# Patient Record
Sex: Male | Born: 1960 | Race: White | Hispanic: No | Marital: Married | Smoking: Current every day smoker
Health system: Southern US, Community
[De-identification: ages and names within clinical notes are randomized; demographics above are authoritative.]

## PROBLEM LIST (undated history)

## (undated) DIAGNOSIS — I10 Essential (primary) hypertension: Secondary | ICD-10-CM

## (undated) DIAGNOSIS — C801 Malignant (primary) neoplasm, unspecified: Secondary | ICD-10-CM

## (undated) DIAGNOSIS — E7800 Pure hypercholesterolemia, unspecified: Secondary | ICD-10-CM

## (undated) DIAGNOSIS — S31119A Laceration without foreign body of abdominal wall, unspecified quadrant without penetration into peritoneal cavity, initial encounter: Secondary | ICD-10-CM

## (undated) HISTORY — PX: HERNIA REPAIR: SHX51

## (undated) HISTORY — PX: OTHER SURGICAL HISTORY: SHX169

---

## 2009-03-30 ENCOUNTER — Emergency Department: Payer: Self-pay | Admitting: Emergency Medicine

## 2009-07-26 ENCOUNTER — Telehealth: Payer: Self-pay | Admitting: Family Medicine

## 2009-07-26 ENCOUNTER — Ambulatory Visit: Payer: Self-pay | Admitting: Family Medicine

## 2009-07-26 DIAGNOSIS — F329 Major depressive disorder, single episode, unspecified: Secondary | ICD-10-CM

## 2009-07-26 DIAGNOSIS — E785 Hyperlipidemia, unspecified: Secondary | ICD-10-CM

## 2009-07-26 DIAGNOSIS — N508 Other specified disorders of male genital organs: Secondary | ICD-10-CM

## 2009-07-26 DIAGNOSIS — I10 Essential (primary) hypertension: Secondary | ICD-10-CM | POA: Insufficient documentation

## 2009-07-26 DIAGNOSIS — G4700 Insomnia, unspecified: Secondary | ICD-10-CM

## 2009-07-26 DIAGNOSIS — F411 Generalized anxiety disorder: Secondary | ICD-10-CM | POA: Insufficient documentation

## 2009-07-27 LAB — CONVERTED CEMR LAB
ALT: 28 units/L (ref 0–53)
AST: 22 units/L (ref 0–37)
Albumin: 4.2 g/dL (ref 3.5–5.2)
Alkaline Phosphatase: 67 units/L (ref 39–117)
BUN: 14 mg/dL (ref 6–23)
Bilirubin, Direct: 0.1 mg/dL (ref 0.0–0.3)
CO2: 25 meq/L (ref 19–32)
Calcium: 9.4 mg/dL (ref 8.4–10.5)
Chloride: 104 meq/L (ref 96–112)
Cholesterol: 175 mg/dL (ref 0–200)
Creatinine, Ser: 0.9 mg/dL (ref 0.4–1.5)
Direct LDL: 115.6 mg/dL
GFR calc non Af Amer: 95.54 mL/min (ref >60)
Glucose, Bld: 75 mg/dL (ref 70–99)
HDL: 30.6 mg/dL — ABNORMAL LOW (ref >39.00)
Potassium: 4.4 meq/L (ref 3.5–5.1)
Sodium: 140 meq/L (ref 135–145)
Total Bilirubin: 0.7 mg/dL (ref 0.3–1.2)
Total CHOL/HDL Ratio: 6
Total Protein: 7.3 g/dL (ref 6.0–8.3)
Triglycerides: 358 mg/dL — ABNORMAL HIGH (ref 0.0–149.0)
VLDL: 71.6 mg/dL — ABNORMAL HIGH (ref 0.0–40.0)

## 2009-07-30 ENCOUNTER — Encounter: Admission: RE | Admit: 2009-07-30 | Discharge: 2009-07-30 | Payer: Self-pay | Admitting: Family Medicine

## 2009-08-16 ENCOUNTER — Ambulatory Visit: Payer: Self-pay | Admitting: Family Medicine

## 2009-08-16 DIAGNOSIS — F17200 Nicotine dependence, unspecified, uncomplicated: Secondary | ICD-10-CM

## 2009-08-30 ENCOUNTER — Ambulatory Visit: Payer: Self-pay | Admitting: Pulmonary Disease

## 2009-09-11 DIAGNOSIS — F518 Other sleep disorders not due to a substance or known physiological condition: Secondary | ICD-10-CM

## 2009-09-11 DIAGNOSIS — G2581 Restless legs syndrome: Secondary | ICD-10-CM

## 2009-09-11 DIAGNOSIS — G4733 Obstructive sleep apnea (adult) (pediatric): Secondary | ICD-10-CM | POA: Insufficient documentation

## 2009-09-28 ENCOUNTER — Telehealth: Payer: Self-pay | Admitting: Pulmonary Disease

## 2009-10-12 ENCOUNTER — Encounter (INDEPENDENT_AMBULATORY_CARE_PROVIDER_SITE_OTHER): Payer: Self-pay | Admitting: *Deleted

## 2010-04-09 NOTE — Assessment & Plan Note (Signed)
Summary: ROA FOR 3 WEEK FOLLOW-UP/JRR   Vital Signs:  Patient profile:   50 year old male Height:      67 inches Weight:      239.13 pounds BMI:     37.59 Temp:     98.2 degrees F oral Pulse rate:   64 / minute Pulse rhythm:   regular BP sitting:   124 / 82  (left arm) Cuff size:   large  Vitals Entered By: Linde Gillis CMA Duncan Dull) (August 16, 2009 8:03 AM) CC: 3 week follow up   History of Present Illness: 50 yo here to follow up anxiety, depression, HDL, HTN and tobacco abuse.  1. HTN- has not been taking his Lisinopril 10 mg that was prescribed.  No cough or other side effects that he is aware of.  He wants to make sure he truly needs it.  Has not been taking it this month and BP is normal today!  2.  Anxiety/depression- always had issues with anxiety.  Has had panic attacks in past although it has not happened in months.  Also endorses symptoms of depression- anhedonia, tearfulness, fatigue.  No SI or HI.  Was prescribed Lexapro but he stopped taking it due to sexual side effects. Started Wellbutrin 150 mg last month, he feels like symptoms already improved.  More drive to get up and do things.  Eating less, has lost 5 pounds!  Sexual desire is back AND he has cut back cigarette smoking from 2 ppd to 1 ppd!.  3.  Hypertriglyercidemia- TG 358 last month, LDL 115.  Started Tricor 145 mg daily, no noticible side effects.    Current Medications (verified): 1)  Ropinirole Hcl 2 Mg Tabs (Ropinirole Hcl) .... Take One Tablet Daily At Bedtime 2)  Tricor 145 Mg Tabs (Fenofibrate) .... Take One Tablet By Mouth Daily 3)  Wellbutrin Xl 300 Mg Xr24h-Tab (Bupropion Hcl) .Marland Kitchen.. 1 Tab By Mouth Every Morning.  Allergies (verified): No Known Drug Allergies  Review of Systems      See HPI General:  Denies malaise. CV:  Denies chest pain or discomfort. MS:  Denies muscle aches. Psych:  Denies anxiety and depression.  Physical Exam  General:  overweight-appearing.   NAD weight down 5  pounds! Mouth:  good dentition.   Lungs:  normal respiratory effort, no intercostal retractions, no accessory muscle use, and normal breath sounds.   Heart:  normal rate, regular rhythm, and no murmur.   Extremities:  No clubbing, cyanosis, edema, or deformity noted with normal full range of motion of all joints.   Psych:  Cognition and judgment appear intact. Alert and cooperative with normal attention span and concentration. No apparent delusions, illusions, hallucinations Much more upbeat today.   Impression & Recommendations:  Problem # 1:  DEPRESSION (ICD-311) Assessment Improved Will increase dose of Wellbutrin to 300 mg XL.  Pt to call in one month with update. The following medications were removed from the medication list:    Wellbutrin Sr 150 Mg Xr12h-tab (Bupropion hcl) .Marland Kitchen... 1 tab by mouth every morning. His updated medication list for this problem includes:    Wellbutrin Xl 300 Mg Xr24h-tab (Bupropion hcl) .Marland Kitchen... 1 tab by mouth every morning.  Problem # 2:  HYPERLIPIDEMIA (ICD-272.4) Assessment: Unchanged No noticible side effects from Tricor.  Continue current dose, keep lab appt to recheck in 2 months. Discussed importance of continued weight loss, decreasing added sugars, eliminating trans fats, increasing fiber and limiting alcohol.  All these changes together  can drop triglycerides by almost 50%.  His updated medication list for this problem includes:    Tricor 145 Mg Tabs (Fenofibrate) .Marland Kitchen... Take one tablet by mouth daily  Problem # 3:  TOBACCO ABUSE (ICD-305.1) Assessment: Improved Congratulated on cutting back!  Problem # 4:  HYPERTENSION (ICD-401.9) Assessment: Improved Normal today.  Ok with continuing to hold Lisinopril.  May not need to restart if he continues losing weight and improving his lifestyle. The following medications were removed from the medication list:    Lisinopril 10 Mg Tabs (Lisinopril) .Marland Kitchen... Take one tablet by mouth daily  Complete  Medication List: 1)  Ropinirole Hcl 2 Mg Tabs (Ropinirole hcl) .... Take one tablet daily at bedtime 2)  Tricor 145 Mg Tabs (Fenofibrate) .... Take one tablet by mouth daily 3)  Wellbutrin Xl 300 Mg Xr24h-tab (Bupropion hcl) .Marland Kitchen.. 1 tab by mouth every morning. Prescriptions: WELLBUTRIN XL 300 MG XR24H-TAB (BUPROPION HCL) 1 tab by mouth every morning.  #30 x 6   Entered and Authorized by:   Ruthe Mannan MD   Signed by:   Ruthe Mannan MD on 08/16/2009   Method used:   Electronically to        [id number] (retail)       [address]       [place], [place]  [address]       Ph: [phone]       Fax: [phone]   RxID:   [id number]   Current Allergies (reviewed today): No known allergies

## 2010-04-09 NOTE — Progress Notes (Signed)
Summary: nos appt  Phone Note Call from Patient   Caller: [email or web address]  Call For: Adrin Julian Summary of Call: ATC to [facility]sc nos from 7/21, phone disconnected. Initial call taken by: Darletta Moll,  September 28, 2009 2:14 PM
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
## 2010-04-09 NOTE — Letter (Signed)
Summary: [place] No Show Letter  [place] [facility]  [address] [place], [place] [address]   Phone: [phone]  Fax: [phone]    10/12/2009 MRN: [id number]  Hunter Johnston [address] [place], [place]  [address]   Dear Mr. FIORELLA,   Our records indicate that you missed your scheduled appointment with __lab___________________ on ___8.4.11_________.  Please contact this office to reschedule your appointment as soon as possible.  It is important that you keep your scheduled appointments with your physician, so we can provide you the best care possible.  Please be advised that there may be a charge for "no show" appointments.    Sincerely,   [place] [facility]

## 2010-04-09 NOTE — Progress Notes (Signed)
Summary: ? Pulled muscle  Phone Note Call from Patient   Caller: Patient Call For: Ruthe Mannan MD Summary of Call: Patient stopped at front desk on his way out today and wanted Dr. Dayton Martes to be aware that he is having back pain.  Does not think it is related to kidney stones but thinks it may be a pulled muscle.  I spoke with his girlfriend/wife briefly and she said that he has taken nothing for pain and has not used heat to try and relieve the pain.  Please advise. Initial call taken by: Linde Gillis CMA Duncan Dull),  Jul 26, 2009 11:34 AM  Follow-up for Phone Call        Agreed.  Try OTC ibuprofen, heat/ice.  Will discuss at follow up as does not sound urgent. Ruthe Mannan MD  Jul 26, 2009 11:46 AM

## 2010-04-09 NOTE — Assessment & Plan Note (Signed)
Summary: NEW PT TO EST/CLE   Vital Signs:  Patient profile:   50 year old male Height:      67 inches Weight:      244.38 pounds BMI:     38.41 Temp:     98.2 degrees F oral Pulse rate:   68 / minute Pulse rhythm:   regular BP sitting:   140 / 90  (left arm) Cuff size:   large  Vitals Entered By: Linde Gillis CMA Duncan Dull) (Jul 26, 2009 10:29 AM) CC: new patient, establish care   History of Present Illness: 50 yo here to establish care with multiple issues to discuss today.  1. HTN- has not been taking his Lisinopril 10 mg that was prescribed.  No cough or other side effects that he is aware of.  He wants to make sure he truly needs it.  2.  Insomnia- chronic issue, deteriorating over several years.  Girlfriend says he legs move in bed, has a hard time falling asleep.  Once he is asleep, he snores and wakes himself up.  She is unsure if he "chokes" in his sleep.  Has been on Ambien, Benzos, most recently Requip which he feels is not helping much.  3.  Anxiety- always had issues with anxiety.  Has had panic attacks in past although it has not happened in months.  Also endorses symptoms of depression- anhedonia, tearfulness, fatigue.  No SI or HI.  Was prescribed Lexapro but he stopped taking it due to sexual side effects.  4.  Tobacco abuse- 2 ppd x 30 years.  Quit for one year once, cold [place].  Not interested in quitting, says he enjoys smoking.  5.  Right scrotal mass- noticed it several months ago, slowly getting bigger, sometimes a little sore.  No redness or wamrth.  Not draning.  No fevers or chills.  Preventive Screening-Counseling & Management  Caffeine-Diet-Exercise     Does Patient Exercise: no      Drug Use:  no.    Current Medications (verified): 1)  Lisinopril 10 Mg Tabs (Lisinopril) .... Take One Tablet By Mouth Daily 2)  Ropinirole Hcl 2 Mg Tabs (Ropinirole Hcl) .... Take One Tablet Daily At Bedtime 3)  Proventil Hfa 108 (90 Base) Mcg/act Aers (Albuterol  Sulfate) .... Take As Directed 4)  Wellbutrin Sr 150 Mg Xr12h-Tab (Bupropion Hcl) .Marland Kitchen.. 1 Tab By Mouth Every Morning.  Allergies (verified): No Known Drug Allergies  Past History:  Past Medical History: Anxiety Depression Hyperlipidemia Hypertension Tobacco Abuse  Past Surgical History: Denies surgical history  Family History: Reviewed history and no changes required. Dad- Mi in 48s, lived into his 53s Mom still alive  Social History: Reviewed history and no changes required. Smokes 2 ppd lives with his girlfriend. works for a Production assistant, radio, does Youth worker. Drug use-no Regular exercise-no Drug Use:  no Does Patient Exercise:  no  Review of Systems      See HPI General:  Denies fever. Eyes:  Denies blurring. ENT:  Denies difficulty swallowing. CV:  Denies chest pain or discomfort, difficulty breathing at night, difficulty breathing while lying down, leg cramps with exertion, palpitations, and shortness of breath with exertion. Resp:  Denies shortness of breath. GI:  Denies abdominal pain and change in bowel habits. GU:  Complains of decreased libido and erectile dysfunction; denies discharge, dysuria, incontinence, urinary frequency, and urinary hesitancy. MS:  Denies joint pain, joint redness, and joint swelling. Derm:  Complains of lesion(s). Neuro:  Denies visual disturbances. Psych:  Complains of anxiety, depression, easily tearful, irritability, and panic attacks; denies mental problems, sense of great danger, suicidal thoughts/plans, thoughts of violence, unusual visions or sounds, and thoughts /plans of harming others. Endo:  Denies cold intolerance, heat intolerance, and polyuria. Heme:  Denies abnormal bruising and bleeding.  Physical Exam  General:  overweight-appearing.   NAD Head:  normocephalic and atraumatic.   Eyes:  vision grossly intact, pupils equal, and pupils round.   Ears:  R ear normal and L ear normal.   Nose:  no external deformity.     Mouth:  good dentition.   Lungs:  normal respiratory effort, no intercostal retractions, no accessory muscle use, and normal breath sounds.   Heart:  normal rate, regular rhythm, and no murmur.   Abdomen:  soft, non-tender, and normal bowel sounds.   Genitalia:  Testes bilaterally descended, 2 cm right scrotal mass, mildly TTP Msk:  No deformity or scoliosis noted of thoracic or lumbar spine.   Extremities:  No clubbing, cyanosis, edema, or deformity noted with normal full range of motion of all joints.   Neurologic:  alert & oriented X3.   Skin:  Intact without suspicious lesions or rashes Psych:  Cognition and judgment appear intact. Alert and cooperative with normal attention span and concentration. No apparent delusions, illusions, hallucinations   Impression & Recommendations:  Problem # 1:  OTHER SPECIFIED DISORDER OF MALE GENITAL ORGANS (ICD-608.89) Assessment New Appears most consistent with a small abscess but will get an ultrasound to confirm and rule out mass. Orders: Radiology Referral (Radiology)  Problem # 2:  INSOMNIA, CHRONIC (ICD-307.42) Assessment: Deteriorated Discussed sleep hygeine.  I do feel he needs a sleep study given long term insomnia having failed chronic meds with possible symptoms of sleep apnea (with obesiity and HTN).  Orders: Sleep Disorder Referral (Sleep Disorder)  Problem # 3:  DEPRESSION (ICD-311) Assessment: Deteriorated Discussed options.  Will try Wellbutrin given low sexual side effects and hopefully added benefit of decreased desire to smoke.  Pt to follow up in one month. The following medications were removed from the medication list:    Lexapro 20 Mg Tabs (Escitalopram oxalate) .Marland Kitchen... Take one tablet by mouth daily His updated medication list for this problem includes:    Wellbutrin Sr 150 Mg Xr12h-tab (Bupropion hcl) .Marland Kitchen... 1 tab by mouth every morning.  Problem # 4:  HYPERTENSION (ICD-401.9) Assessment: Unchanged Discussed importance  of taking BP medication.  Agreed to check labs today, including BMET first and then will call him to restart his Lisinopril. His updated medication list for this problem includes:    Lisinopril 10 Mg Tabs (Lisinopril) .Marland Kitchen... Take one tablet by mouth daily  Problem # 5:  SCREENING FOR LIPOID DISORDERS (ICD-V77.91)  Orders: Venipuncture ([address]) TLB-Lipid Panel (80061-LIPID)  Complete Medication List: 1)  Lisinopril 10 Mg Tabs (Lisinopril) .... Take one tablet by mouth daily 2)  Ropinirole Hcl 2 Mg Tabs (Ropinirole hcl) .... Take one tablet daily at bedtime 3)  Proventil Hfa 108 (90 Base) Mcg/act Aers (Albuterol sulfate) .... Take as directed 4)  Wellbutrin Sr 150 Mg Xr12h-tab (Bupropion hcl) .Marland Kitchen.. 1 tab by mouth every morning.  Other Orders: TLB-BMP (Basic Metabolic Panel-BMET) (80048-METABOL) TLB-Hepatic/Liver Function Pnl (80076-HEPATIC)  Patient Instructions: 1)  Great to meet you. 2)  please stop by to see Shirlee Limerick on your way out to set up your sleep study and ultrasound. 3)  Please make appointment to come see me in 3 weeks. Prescriptions: ROPINIROLE HCL 2 MG TABS (ROPINIROLE  HCL) take one tablet daily at bedtime  #30 x 0   Entered and Authorized by:   Ruthe Mannan MD   Signed by:   Ruthe Mannan MD on 07/26/2009   Method used:   Print then Give to Patient   RxID:   [id number] WELLBUTRIN SR 150 MG XR12H-TAB (BUPROPION HCL) 1 tab by mouth every morning.  #30 x 0   Entered and Authorized by:   Ruthe Mannan MD   Signed by:   Ruthe Mannan MD on 07/26/2009   Method used:   Print then Give to Patient   RxID:   [id number]   Current Allergies (reviewed today): No known allergies   TD Result Date:  06/30/2007 TD Result:  historical TD Next Due:  10 yr

## 2011-04-02 IMAGING — US US SCROTUM
1 series · 14 of 25 positions shown · non-contrast
Comparison: None.

CLINICAL DATA: Palpable area of the right side for 1 month, no pain

ULTRASOUND OF SCROTUM
TECHNIQUE: Complete ultrasound examination of the testicles,
epididymis, and other scrotal structures was performed.

[Series 1: us scrotum · 0.13mm/px · 14 of 45 slices shown]
[im 1/45]
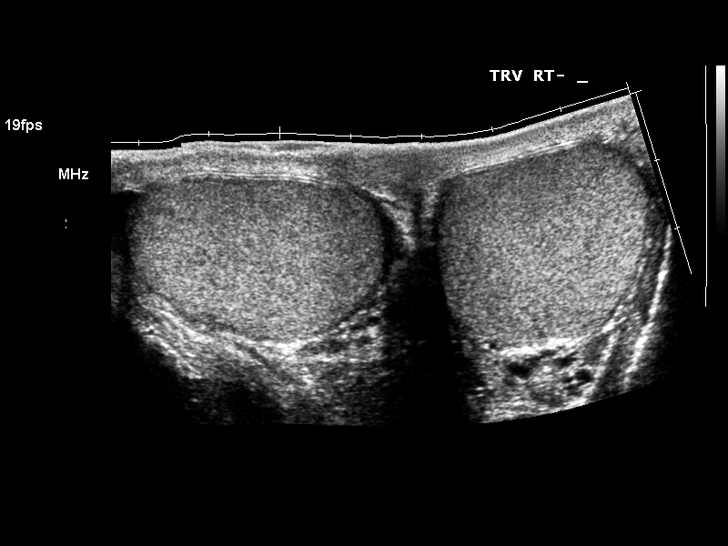
[im 4/45]
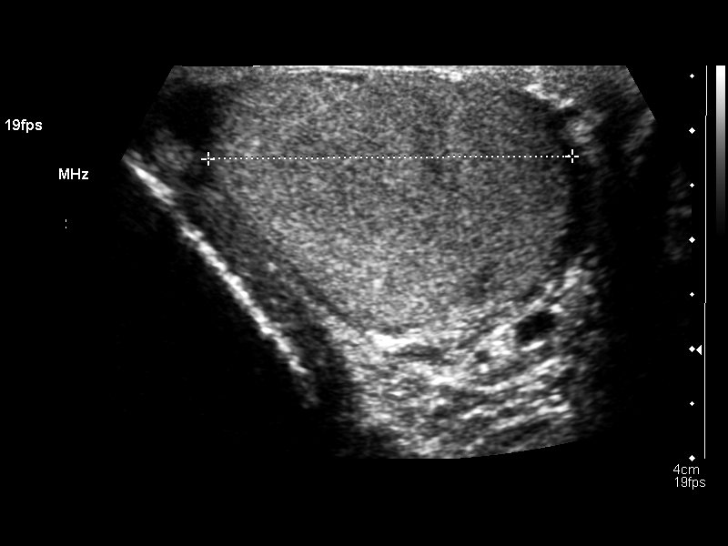
[im 8/45]
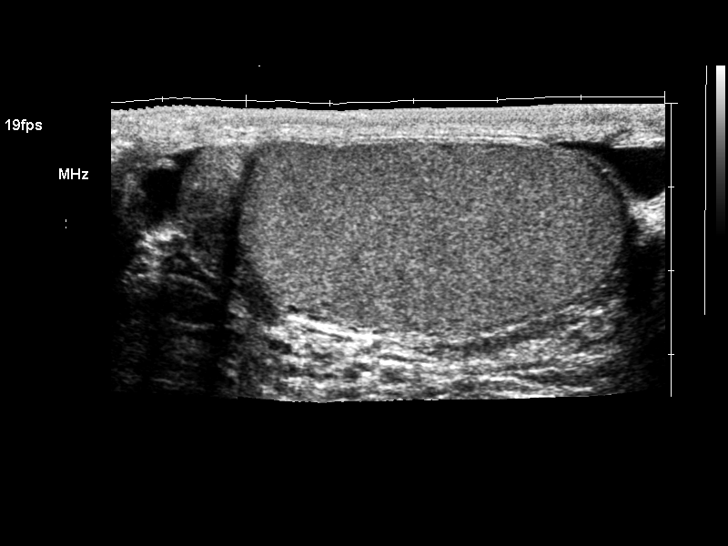
[im 12/45]
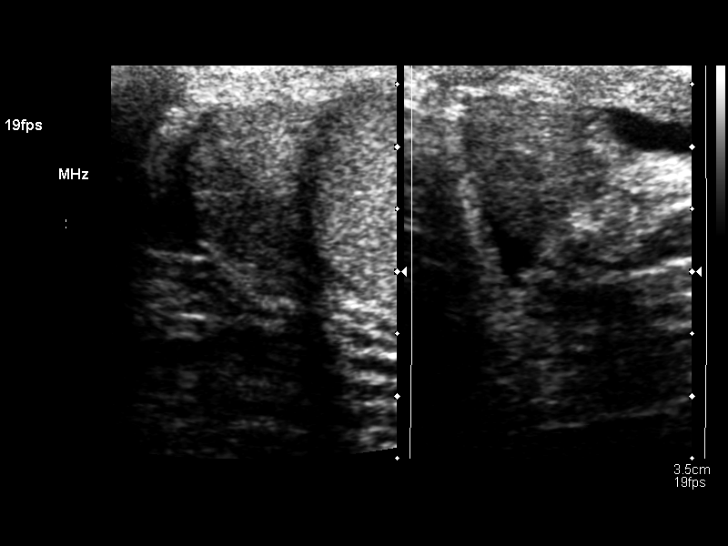
[im 15/45]
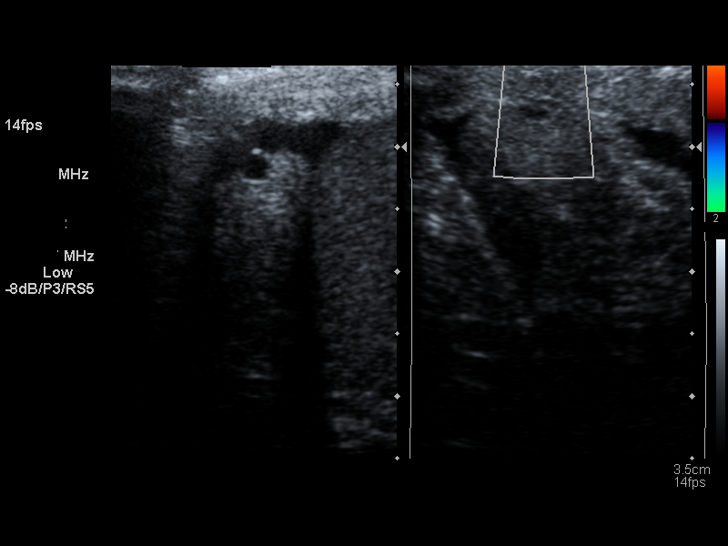
[im 17/45]
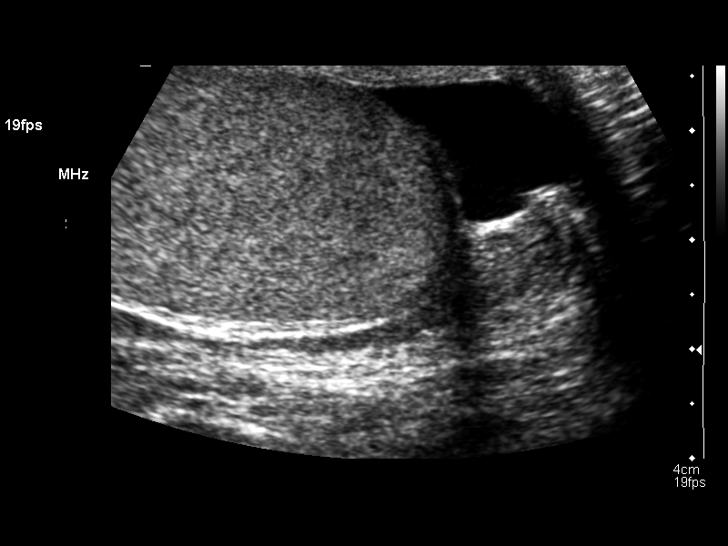
[im 21/45]
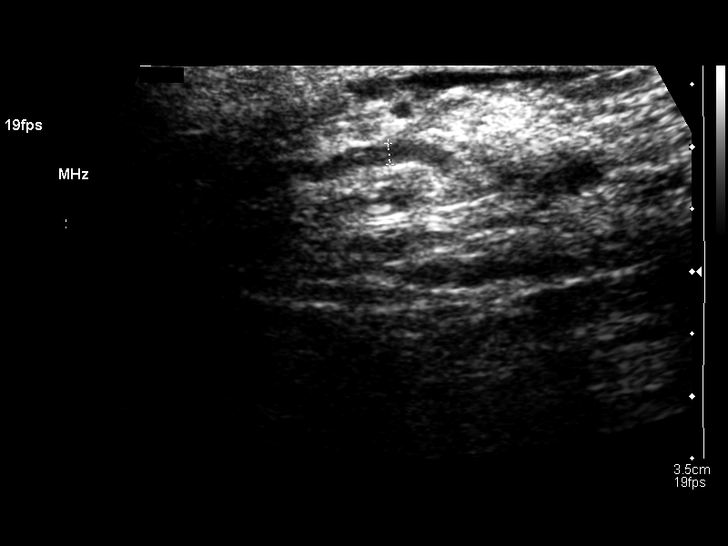
[im 24/45]
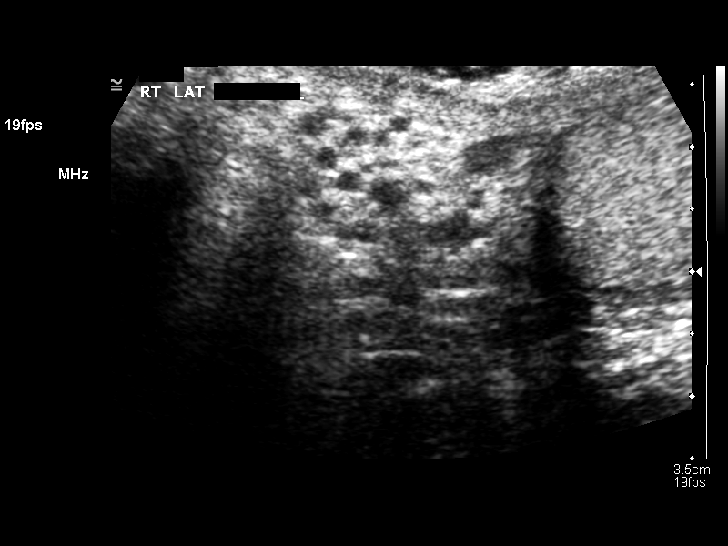
[im 28/45]
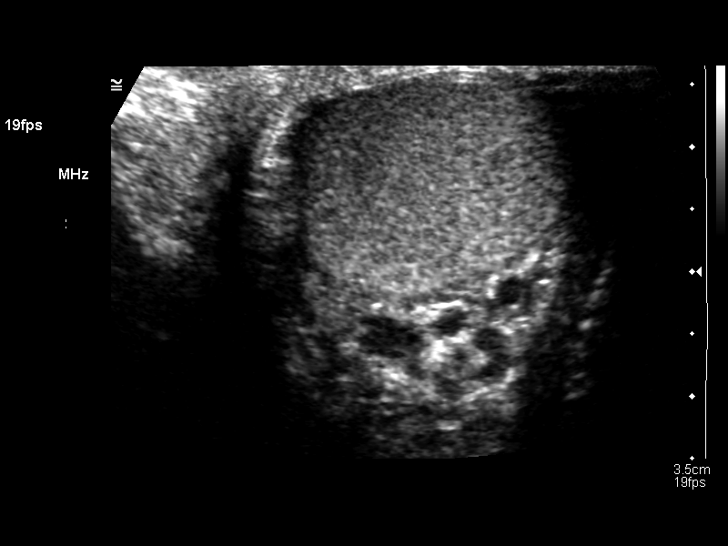
[im 30/45]
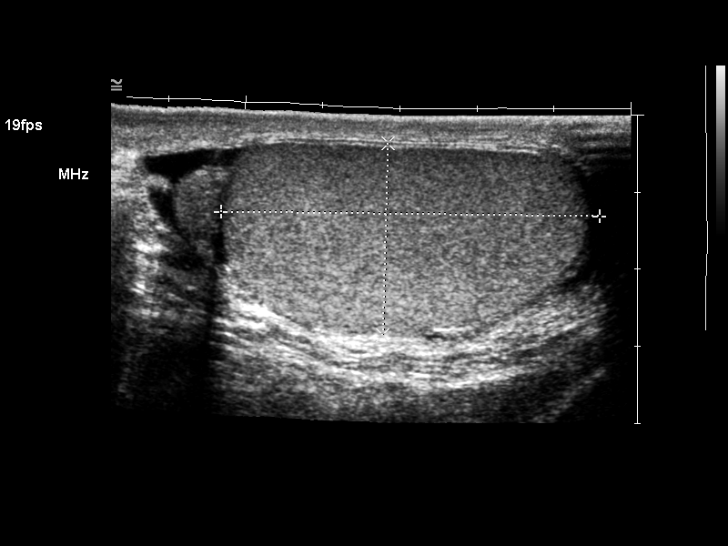
[im 34/45]
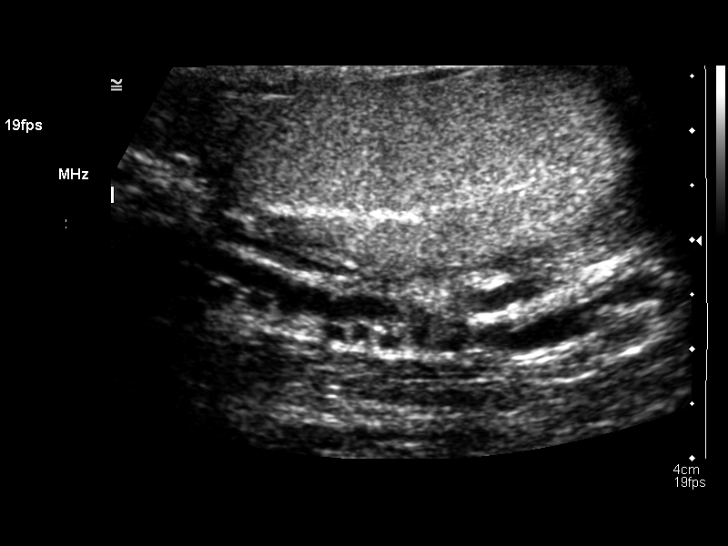
[im 37/45]
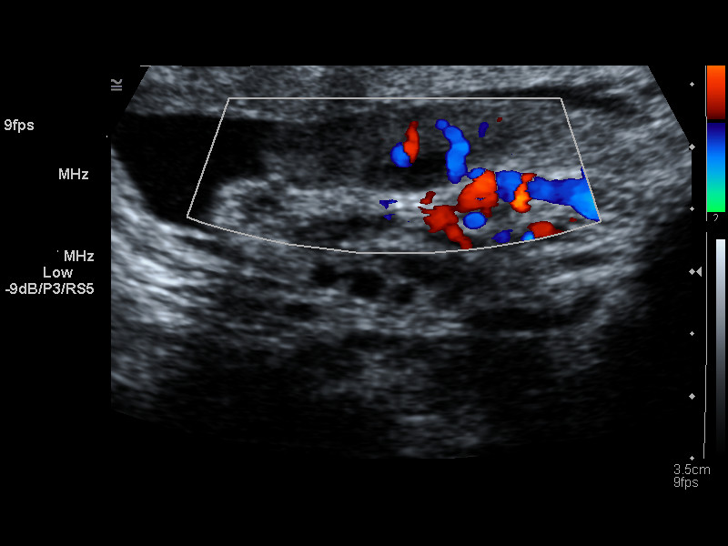
[im 41/45]
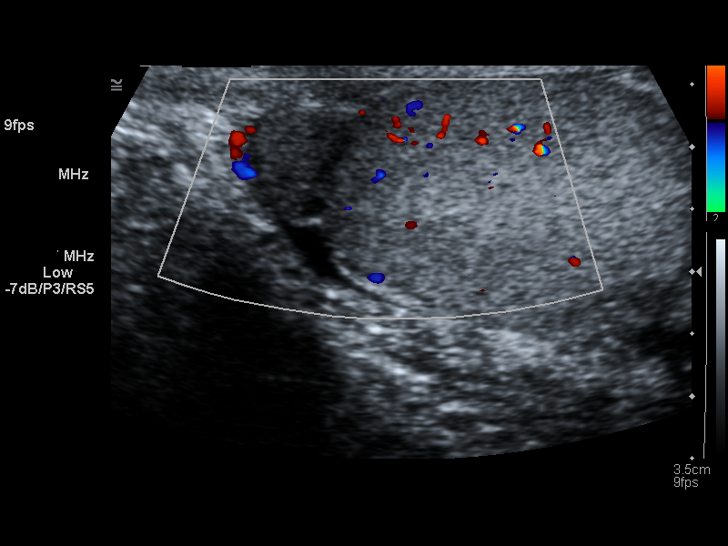
[im 45/45]
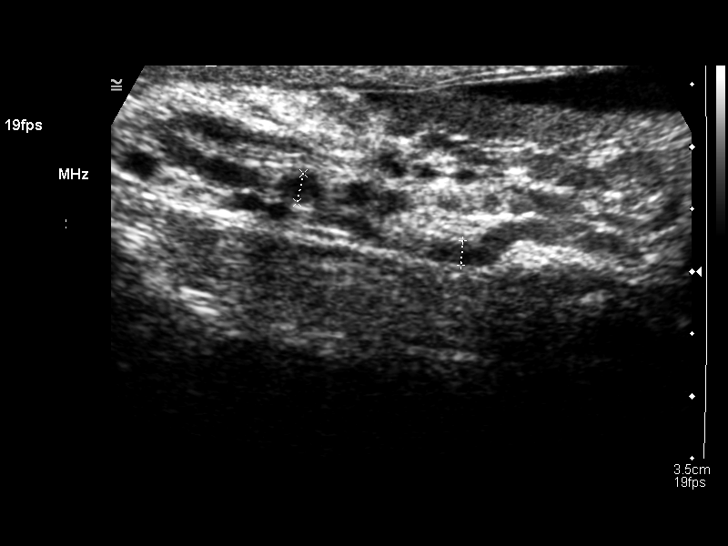

[14 of 25 positions shown; findings below may reference images not displayed]

FINDINGS: No intratesticular abnormality is seen.  Both testicles
are normal in size and in echogenicity.  There is blood flow
documented to both testicles.  A small epididymal cyst is noted on
the right of 2 x 2 x 2 mm.  The left epididymis is normal.  No
hydrocele or varicocele is seen.  No abnormality is noted at the
site questioned clinically.
IMPRESSION: 1.  No intratesticular abnormality.  There is blood flow to both
testicles.
2.  Small 2 mm right epididymal cyst.

## 2012-04-01 ENCOUNTER — Encounter: Payer: Self-pay | Admitting: *Deleted

## 2012-04-01 NOTE — Telephone Encounter (Signed)
This encounter was created in error - please disregard.

## 2016-05-01 ENCOUNTER — Encounter (HOSPITAL_COMMUNITY): Payer: Self-pay | Admitting: Emergency Medicine

## 2016-05-01 ENCOUNTER — Emergency Department (HOSPITAL_COMMUNITY)
Admission: EM | Admit: 2016-05-01 | Discharge: 2016-05-01 | Disposition: A | Discharge status: Home / Self Care | Payer: Managed Care, Other (non HMO) | Attending: Emergency Medicine | Admitting: Emergency Medicine

## 2016-05-01 DIAGNOSIS — K645 Perianal venous thrombosis: Secondary | ICD-10-CM | POA: Diagnosis not present

## 2016-05-01 DIAGNOSIS — K6289 Other specified diseases of anus and rectum: Secondary | ICD-10-CM | POA: Diagnosis present

## 2016-05-01 DIAGNOSIS — F17210 Nicotine dependence, cigarettes, uncomplicated: Secondary | ICD-10-CM | POA: Diagnosis not present

## 2016-05-01 DIAGNOSIS — Z7982 Long term (current) use of aspirin: Secondary | ICD-10-CM | POA: Insufficient documentation

## 2016-05-01 DIAGNOSIS — I10 Essential (primary) hypertension: Secondary | ICD-10-CM | POA: Diagnosis not present

## 2016-05-01 DIAGNOSIS — Z79899 Other long term (current) drug therapy: Secondary | ICD-10-CM | POA: Diagnosis not present

## 2016-05-01 DIAGNOSIS — Z8546 Personal history of malignant neoplasm of prostate: Secondary | ICD-10-CM | POA: Diagnosis not present

## 2016-05-01 HISTORY — DX: Malignant (primary) neoplasm, unspecified: C80.1

## 2016-05-01 HISTORY — DX: Essential (primary) hypertension: I10

## 2016-05-01 HISTORY — DX: Laceration without foreign body of abdominal wall, unspecified quadrant without penetration into peritoneal cavity, initial encounter: S31.119A

## 2016-05-01 MED ORDER — NAPROXEN 500 MG PO TABS: 500.0000 mg | ORAL_TABLET | Freq: Two times a day (BID) | ORAL | 0 refills | Status: DC

## 2016-05-01 MED ORDER — SULFAMETHOXAZOLE-TRIMETHOPRIM 800-160 MG PO TABS: 1.0000 | ORAL_TABLET | Freq: Two times a day (BID) | ORAL | 0 refills | Status: AC

## 2016-05-01 MED ORDER — BENZOCAINE 20 % MT AERO: INHALATION_SPRAY | Freq: Once | OROMUCOSAL | Status: DC

## 2016-05-01 MED ORDER — LIDOCAINE-EPINEPHRINE (PF) 2 %-1:200000 IJ SOLN
20.0000 mL | Freq: Once | INTRAMUSCULAR | Status: AC
  Administered 2016-05-01: 20 mL
  Filled 2016-05-01: qty 20

## 2016-05-01 MED ORDER — IBUPROFEN 200 MG PO TABS
600.0000 mg | ORAL_TABLET | Freq: Once | ORAL | Status: AC
  Administered 2016-05-01: 600 mg via ORAL
  Filled 2016-05-01: qty 3

## 2016-05-01 NOTE — ED Triage Notes (Signed)
patient c/o hemorrhoid that has been bothering him since yesterday. Patient reports it getting larger and painful.  patient reports using creams and not helping. Patient reports sometimes constipation. Patient reports bleeding when wipes that is bright red.

## 2016-05-01 NOTE — Discharge Instructions (Signed)
You were seen in the emergency room today for treatment of a thrombosed external hemorrhoid. You did great during the procedure. I gave you a short course of antibiotics to prevent infection. Take naproxen as needed for pain. Take fiber supplements and stool softener to avoid constipation. You may also soak in Sitz baths until the area heals. Follow up with your primary care provider as needed. Return to the ER for new or worsening symptoms.
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
## 2019-09-25 ENCOUNTER — Emergency Department (HOSPITAL_COMMUNITY)
Admission: EM | Admit: 2019-09-25 | Discharge: 2019-09-25 | Disposition: A | Discharge status: Home / Self Care | Payer: No Typology Code available for payment source | Attending: Emergency Medicine | Admitting: Emergency Medicine

## 2019-09-25 ENCOUNTER — Encounter (HOSPITAL_COMMUNITY): Payer: Self-pay | Admitting: Emergency Medicine

## 2019-09-25 ENCOUNTER — Other Ambulatory Visit: Payer: Self-pay

## 2019-09-25 ENCOUNTER — Emergency Department (HOSPITAL_COMMUNITY): Payer: No Typology Code available for payment source

## 2019-09-25 DIAGNOSIS — I10 Essential (primary) hypertension: Secondary | ICD-10-CM | POA: Insufficient documentation

## 2019-09-25 DIAGNOSIS — S93402A Sprain of unspecified ligament of left ankle, initial encounter: Secondary | ICD-10-CM

## 2019-09-25 DIAGNOSIS — Z79899 Other long term (current) drug therapy: Secondary | ICD-10-CM | POA: Insufficient documentation

## 2019-09-25 DIAGNOSIS — Y9389 Activity, other specified: Secondary | ICD-10-CM | POA: Insufficient documentation

## 2019-09-25 DIAGNOSIS — Z7982 Long term (current) use of aspirin: Secondary | ICD-10-CM | POA: Insufficient documentation

## 2019-09-25 DIAGNOSIS — F17210 Nicotine dependence, cigarettes, uncomplicated: Secondary | ICD-10-CM | POA: Diagnosis not present

## 2019-09-25 DIAGNOSIS — Z8546 Personal history of malignant neoplasm of prostate: Secondary | ICD-10-CM | POA: Insufficient documentation

## 2019-09-25 DIAGNOSIS — Y999 Unspecified external cause status: Secondary | ICD-10-CM | POA: Insufficient documentation

## 2019-09-25 DIAGNOSIS — Y929 Unspecified place or not applicable: Secondary | ICD-10-CM | POA: Diagnosis not present

## 2019-09-25 DIAGNOSIS — W010XXA Fall on same level from slipping, tripping and stumbling without subsequent striking against object, initial encounter: Secondary | ICD-10-CM | POA: Insufficient documentation

## 2019-09-25 DIAGNOSIS — Q899 Congenital malformation, unspecified: Secondary | ICD-10-CM

## 2019-09-25 DIAGNOSIS — S99912A Unspecified injury of left ankle, initial encounter: Secondary | ICD-10-CM | POA: Diagnosis present

## 2019-09-25 MED ORDER — FENTANYL CITRATE (PF) 100 MCG/2ML IJ SOLN
50.0000 ug | Freq: Once | INTRAMUSCULAR | Status: AC
Start: 2019-09-25 — End: 2019-09-25
  Administered 2019-09-25: 50 ug via INTRAVENOUS
  Filled 2019-09-25: qty 2

## 2019-09-25 MED ORDER — MELOXICAM 7.5 MG PO TABS: 7.5000 mg | ORAL_TABLET | Freq: Two times a day (BID) | ORAL | 0 refills | Status: AC | PRN

## 2019-09-25 NOTE — ED Triage Notes (Signed)
Patient arrives to ED with complaints of a fall. 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 Patient fell on his left foot and left him. Obvious injury to left foot/ankle. No injury to head or neck.

## 2019-09-25 NOTE — Progress Notes (Signed)
Orthopedic Tech Progress Note Patient Details:  Hunter Johnston 1960-07-23 [id number]  Ortho Devices Type of Ortho Device: Ankle Air splint, Crutches Ortho Device/Splint Location: LLE Ortho Device/Splint Interventions: Ordered, Adjustment   Post Interventions Patient Tolerated: Well Instructions Provided: Care of device, Adjustment of device, Poper ambulation with device   [name] 09/25/2019, 3:56 PM

## 2019-09-25 NOTE — ED Notes (Signed)
Patient Alert and oriented to baseline. Stable and ambulatory to baseline. Patient verbalized understanding of the discharge instructions.  Patient belongings were taken by the patient.   

## 2019-09-25 NOTE — Discharge Instructions (Signed)
Your x-ray shows a normal-appearing ankle, there is no bony injury, no fracture or dislocation.   Please take medication such as ibuprofen or Aleve or as an alternative I have sent a prescription for Mobic into the pharmacy for you.  You can take this medication twice a day as needed for pain, read the attached instructions, wear the splint as needed, use crutches to walk if too much pain  Apply a compressive ACE bandage. Rest and elevate the affected painful area.  Apply cold compresses intermittently as needed.  As pain recedes, begin normal activities slowly as tolerated.  Call if symptoms persist.
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
## 2019-09-28 ENCOUNTER — Emergency Department (HOSPITAL_COMMUNITY)
Admission: EM | Admit: 2019-09-28 | Discharge: 2019-09-28 | Disposition: A | Discharge status: Home / Self Care | Payer: Worker's Compensation

## 2021-05-28 IMAGING — DX DG ANKLE PORT 2V*L*
1 series · 3 of 3 positions shown · non-contrast
Comparison: None.

CLINICAL DATA: Pt states that he fell off of [employer] and twisted
his ankle. There is swelling to the lateral malleolar region.

EXAM:
PORTABLE LEFT ANKLE - 2 VIEW

[Series 1: ankle · 0.14mm/px · 3 of 3 slices shown]
[im 1/3]
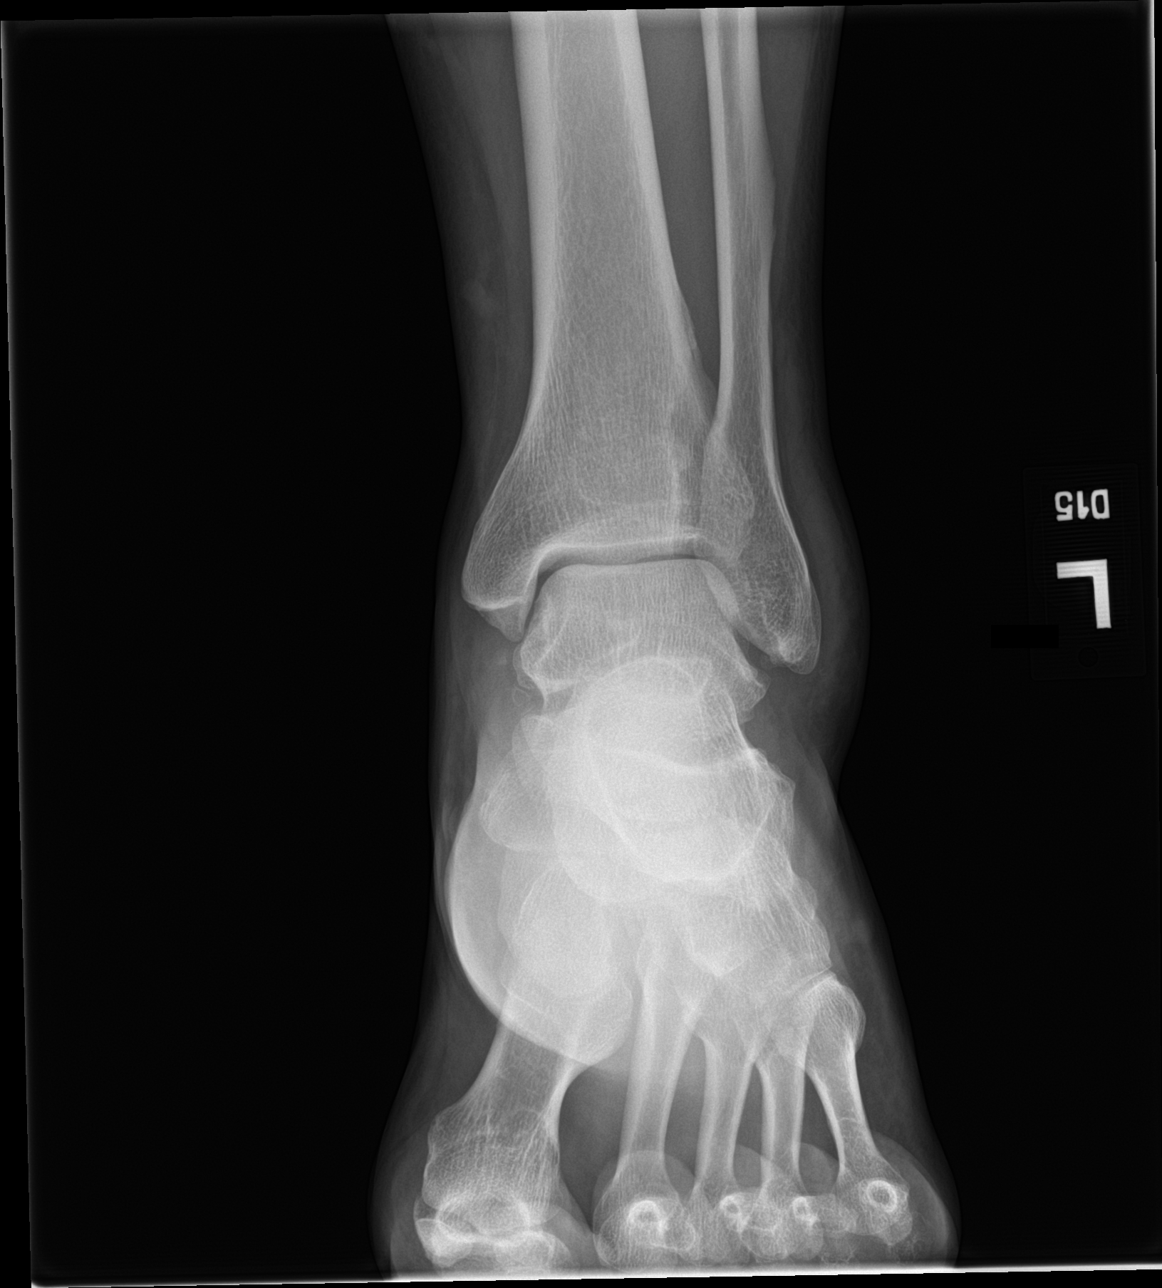
[im 2/3]
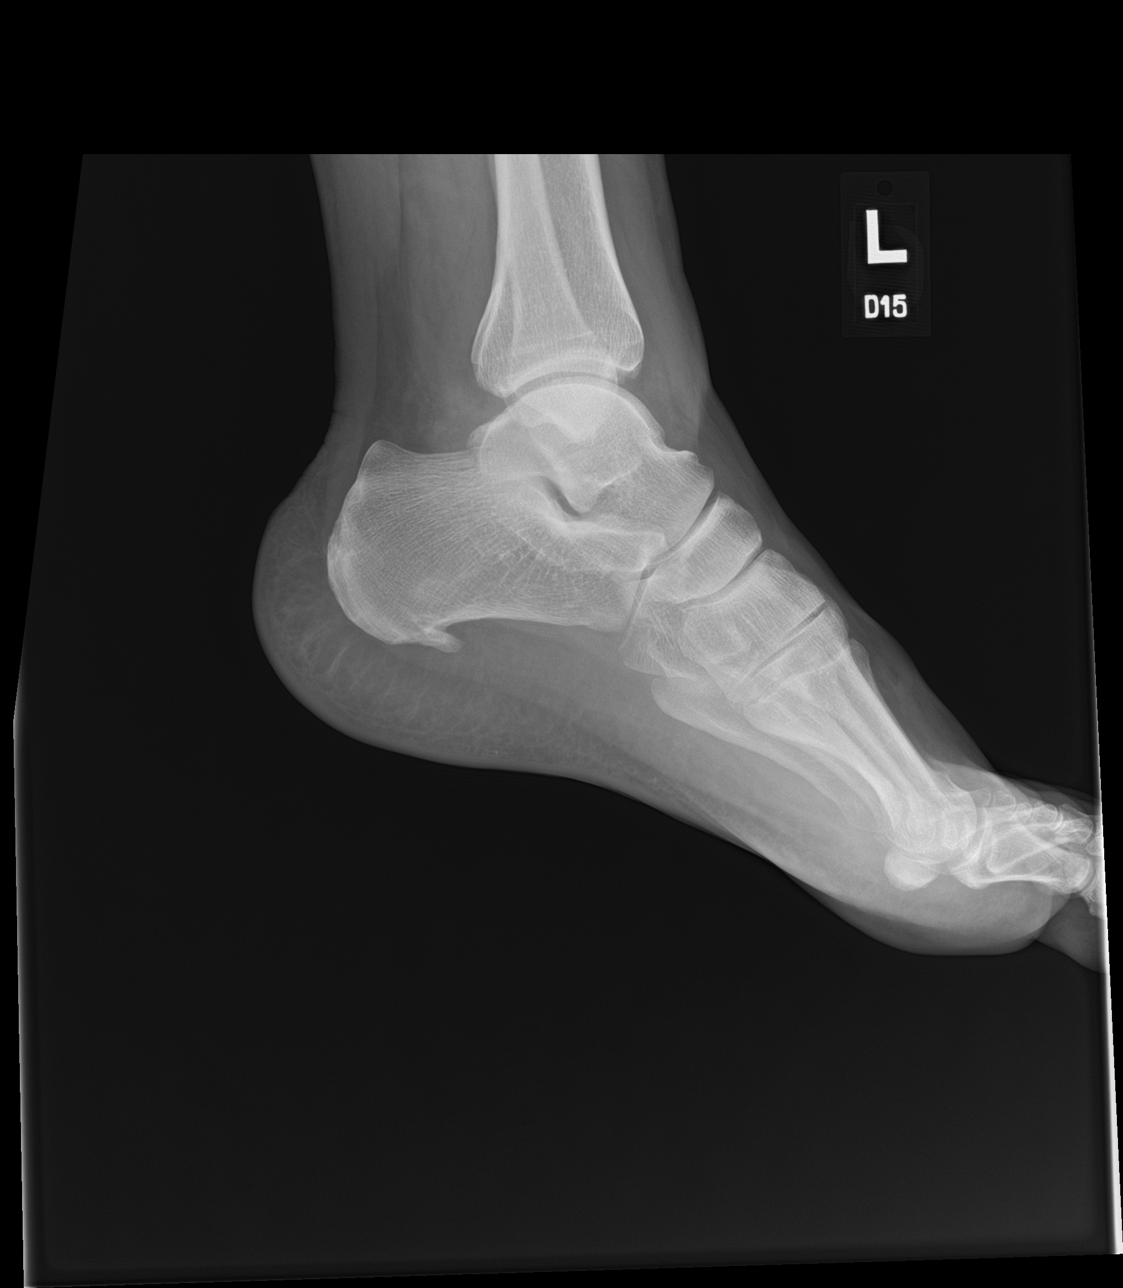
[im 3/3]
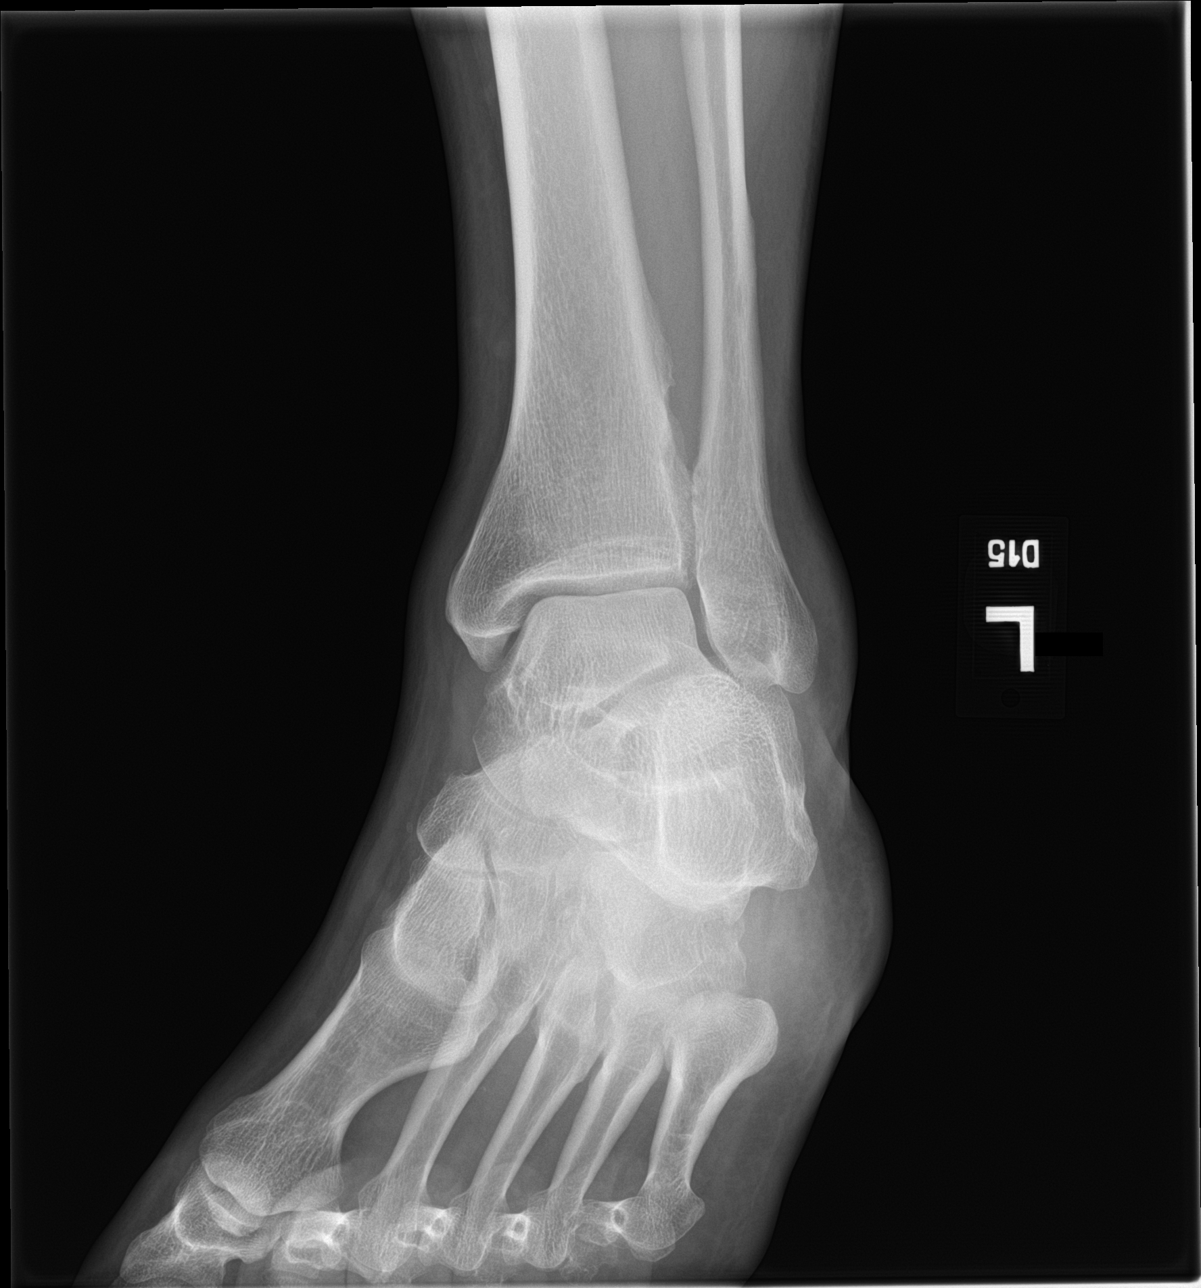

[3 of 3 positions shown; findings below may reference images not displayed]

FINDINGS: There is no evidence of fracture or dislocation.

There is no evidence of arthropathy or other focal bone abnormality.

Lateral ankle soft tissue swelling.
IMPRESSION: Lateral ankle soft tissue swelling. No acute fracture or
dislocation.

## 2022-10-10 ENCOUNTER — Emergency Department (HOSPITAL_COMMUNITY)
Admission: EM | Admit: 2022-10-10 | Discharge: 2022-10-10 | Disposition: A | Discharge status: Home / Self Care | Payer: Self-pay | Attending: Emergency Medicine | Admitting: Emergency Medicine

## 2022-10-10 ENCOUNTER — Emergency Department (HOSPITAL_COMMUNITY): Payer: Self-pay

## 2022-10-10 ENCOUNTER — Encounter (HOSPITAL_COMMUNITY): Payer: Self-pay

## 2022-10-10 ENCOUNTER — Other Ambulatory Visit: Payer: Self-pay

## 2022-10-10 DIAGNOSIS — F17200 Nicotine dependence, unspecified, uncomplicated: Secondary | ICD-10-CM | POA: Insufficient documentation

## 2022-10-10 DIAGNOSIS — J4540 Moderate persistent asthma, uncomplicated: Secondary | ICD-10-CM | POA: Insufficient documentation

## 2022-10-10 DIAGNOSIS — Z8546 Personal history of malignant neoplasm of prostate: Secondary | ICD-10-CM | POA: Insufficient documentation

## 2022-10-10 DIAGNOSIS — R079 Chest pain, unspecified: Secondary | ICD-10-CM

## 2022-10-10 DIAGNOSIS — I10 Essential (primary) hypertension: Secondary | ICD-10-CM | POA: Insufficient documentation

## 2022-10-10 DIAGNOSIS — R0789 Other chest pain: Secondary | ICD-10-CM | POA: Insufficient documentation

## 2022-10-10 DIAGNOSIS — Z7982 Long term (current) use of aspirin: Secondary | ICD-10-CM | POA: Insufficient documentation

## 2022-10-10 HISTORY — DX: Pure hypercholesterolemia, unspecified: E78.00

## 2022-10-10 LAB — COMPREHENSIVE METABOLIC PANEL
ALT: 31 U/L (ref 0–44)
AST: 26 U/L (ref 15–41)
Albumin: 3.9 g/dL (ref 3.5–5.0)
Alkaline Phosphatase: 49 U/L (ref 38–126)
Anion gap: 11 (ref 5–15)
BUN: 18 mg/dL (ref 8–23)
CO2: 24 mmol/L (ref 22–32)
Calcium: 9.1 mg/dL (ref 8.9–10.3)
Chloride: 101 mmol/L (ref 98–111)
Creatinine, Ser: 1.1 mg/dL (ref 0.61–1.24)
GFR, Estimated: >60 mL/min (ref >60)
Glucose, Bld: 223 mg/dL — ABNORMAL HIGH (ref 70–99)
Potassium: 4 mmol/L (ref 3.5–5.1)
Sodium: 136 mmol/L (ref 135–145)
Total Bilirubin: 0.9 mg/dL (ref 0.3–1.2)
Total Protein: 7.1 g/dL (ref 6.5–8.1)

## 2022-10-10 LAB — CBC WITH DIFFERENTIAL/PLATELET
Abs Immature Granulocytes: 0.03 10*3/uL (ref 0.00–0.07)
Basophils Absolute: 0.1 10*3/uL (ref 0.0–0.1)
Basophils Relative: 1 %
Eosinophils Absolute: 0.1 10*3/uL (ref 0.0–0.5)
Eosinophils Relative: 1 %
HCT: 46.8 % (ref 39.0–52.0)
Hemoglobin: 16.2 g/dL (ref 13.0–17.0)
Immature Granulocytes: 0 %
Lymphocytes Relative: 26 %
Lymphs Abs: 2.3 10*3/uL (ref 0.7–4.0)
MCH: 32.2 pg (ref 26.0–34.0)
MCHC: 34.6 g/dL (ref 30.0–36.0)
MCV: 93 fL (ref 80.0–100.0)
Monocytes Absolute: 0.5 10*3/uL (ref 0.1–1.0)
Monocytes Relative: 6 %
Neutro Abs: 5.7 10*3/uL (ref 1.7–7.7)
Neutrophils Relative %: 66 %
Platelets: 152 10*3/uL (ref 150–400)
RBC: 5.03 MIL/uL (ref 4.22–5.81)
RDW: 14 % (ref 11.5–15.5)
WBC: 8.7 10*3/uL (ref 4.0–10.5)
nRBC: 0 % (ref 0.0–0.2)

## 2022-10-10 LAB — TROPONIN I (HIGH SENSITIVITY)
Troponin I (High Sensitivity): 4 ng/L (ref <18)
Troponin I (High Sensitivity): 5 ng/L (ref <18)

## 2022-10-10 LAB — CBG MONITORING, ED: Glucose-Capillary: 127 mg/dL — ABNORMAL HIGH (ref 70–99)

## 2022-10-10 MED ORDER — SODIUM CHLORIDE 0.9 % IV BOLUS
1000.0000 mL | Freq: Once | INTRAVENOUS | Status: AC
  Administered 2022-10-10: 1000 mL via INTRAVENOUS

## 2022-10-10 MED ORDER — IPRATROPIUM-ALBUTEROL 0.5-2.5 (3) MG/3ML IN SOLN
3.0000 mL | Freq: Once | RESPIRATORY_TRACT | Status: AC
  Administered 2022-10-10: 3 mL via RESPIRATORY_TRACT
  Filled 2022-10-10: qty 3

## 2022-10-10 NOTE — ED Provider Notes (Signed)
[place] [facility]PARTMENT AT [facility] Provider Note   CSN: [id number] Arrival date & time: 10/10/22  1222     History  Chief Complaint  Patient presents with   Chest Pain    Hunter Johnston is a 62 y.o. male with past medical history significant for hyperlipidemia, hypertension, tobacco abuse, OSA, moderate persistent asthma, prostate cancer, anxiety presents to the ED via EMS from work with complaint of chest pain.  Patient states chest pain began around 0800 while he was driving.  He reports it started off as mild and progressed to "feeling like an elephant was sitting on his chest".  Per EMS, patient was also diaphoretic and anxious on their arrival.  Patient states his chest pain is central and left sided.  He reports the pain as a "trickle".  He states earlier it radiated into his left arm and back, but now is mainly in his chest.  Patient smokes cigarettes daily.  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  He admits to poor water consumption.  Denies shortness of breath, chest tightness, fever, cough, nausea, vomiting.         Home Medications Prior to Admission medications   Medication Sig Start Date End Date Taking? Authorizing Provider  ALPRAZolam Prudy Feeler) 1 MG tablet Take 1-2 mg by mouth 2 (two) times daily. Pt takes one tablet in the morning and two at bedtime.    [provider]  aspirin 81 MG chewable tablet Chew 81 mg by mouth daily.    [provider]  benzonatate (TESSALON) 100 MG capsule Take 100 mg by mouth every 6 (six) hours as needed for cough.    [provider]  bisoprolol-hydrochlorothiazide (ZIAC) 5-6.25 MG tablet Take 1 tablet by mouth daily.    [provider]  doxepin (SINEQUAN) 10 MG capsule Take 10 mg by mouth at bedtime.    [provider]  DULoxetine (CYMBALTA) 60 MG capsule Take 60 mg by mouth daily.    [provider]  ropinirole (REQUIP) 5 MG  tablet Take 5 mg by mouth at bedtime.    [provider]      Allergies    Gabapentin and Penicillins    Review of Systems   Review of Systems  Cardiovascular:  Positive for chest pain.    Physical Exam Updated Vital Signs BP 129/81   Pulse (!) 58   Temp 97.9 F (36.6 C)   Resp 20   Ht 5\' 7"  (1.702 m)   Wt 108.9 kg   SpO2 98%   BMI 37.59 kg/m  Physical Exam Vitals and nursing note reviewed.  Constitutional:      General: He is not in acute distress.    Appearance: Normal appearance. He is not ill-appearing or diaphoretic.  Cardiovascular:     Rate and Rhythm: Normal rate and regular rhythm.     Chest Wall: PMI is not displaced.     Pulses: Normal pulses.     Heart sounds: Normal heart sounds.  Pulmonary:     Effort: Pulmonary effort is normal. No tachypnea, accessory muscle usage or respiratory distress.     Breath sounds: Normal breath sounds and air entry. No wheezing.  Chest:     Chest wall: Tenderness present. No swelling.       Comments: Tenderness to palpation along left sternal border and laterally as indicated.   Musculoskeletal:     Right lower leg: No edema.  Left lower leg: No edema.  Skin:    General: Skin is warm and dry.     Capillary Refill: Capillary refill takes less than 2 seconds.  Neurological:     Mental Status: He is alert. Mental status is at baseline.  Psychiatric:        Mood and Affect: Mood normal.        Behavior: Behavior normal.     ED Results / Procedures / Treatments   Labs (all labs ordered are listed, but only abnormal results are displayed) Labs Reviewed  COMPREHENSIVE METABOLIC PANEL - Abnormal; Notable for the following components:      Result Value   Glucose, Bld 223 (*)    All other components within normal limits  CBC WITH DIFFERENTIAL/PLATELET  CBG MONITORING, ED  TROPONIN I (HIGH SENSITIVITY)  TROPONIN I (HIGH SENSITIVITY)    EKG EKG Interpretation Date/Time:  Friday October 10 2022 12:34:48  EDT Ventricular Rate:  70 PR Interval:  175 QRS Duration:  82 QT Interval:  388 QTC Calculation: 419 R Axis:   58  Text Interpretation: Sinus rhythm Low voltage, precordial leads Confirmed by Alvino Blood ([id number]) on 10/10/2022 3:02:24 PM  Radiology DG Chest Portable 1 View  Result Date: 10/10/2022 CLINICAL DATA:  Chest pain. EXAM: PORTABLE CHEST 1 VIEW COMPARISON:  None Available. FINDINGS: The heart size and mediastinal contours are within normal limits. Minimal bibasilar subsegmental atelectasis or scarring is noted. The visualized skeletal structures are unremarkable. IMPRESSION: Minimal bibasilar subsegmental atelectasis or scarring. Electronically Signed   By: Lupita Raider M.D.   On: 10/10/2022 13:54    Procedures Procedures    Medications Ordered in ED Medications  sodium chloride 0.9 % bolus 1,000 mL (1,000 mLs Intravenous New Bag/Given 10/10/22 1349)  ipratropium-albuterol (DUONEB) 0.5-2.5 (3) MG/3ML nebulizer solution 3 mL (3 mLs Nebulization Given 10/10/22 1536)    ED Course/ Medical Decision Making/ A&P Clinical Course as of 10/10/22 1540  Fri Oct 10, 2022  1522 Here with chest pain and tightness. Hx of copd. Getting breathing treatment. Work up otherwise unremarkable.  - f/u second troponin - he will follow up with hx cardiologist and pcp [CD]    Clinical Course User Index [CD] Rhys Martini, DO                                 Medical Decision Making Amount and/or Complexity of Data Reviewed Labs: ordered. Radiology: ordered.   This patient presents to the ED with chief complaint(s) of chest pain and tightness with pertinent past medical history of HTN, tobacco abuse, hyperlipidemia, OSA, moderate asthma.  The complaint involves an extensive differential diagnosis and also carries with it a high risk of complications and morbidity.    The differential diagnosis includes ACS, asthma exacerbation, pericarditis, aortic dissection, PE, angina, pleuritis,  metabolic derangement   The initial plan is to obtain chest pain workup  Additional history obtained: Additional history obtained from family at bedside who report patient "smokes like a freight train" and patient is not compliant with all medications including his daily inhalers. EMS report patient was diaphoretic and anxious.  EMS gave patient 2x 0.4 mg nitroglycerin, 4 mg morphine, and 324 mg ASA en route to ED.  Records reviewed Primary Care Documents  Initial Assessment:   Exam significant for overall well appearing patient who is not in acute respiratory distress.  Lungs are clear to auscultation bilaterally.  Patient does  report slight increase in chest pain with breathing.  Heart rate is normal in the 60s with regular rhythm.  Sinus rhythm on the monitor.  Tenderness to palpation of the left anterior chest wall mainly along the left sternal border.  No overlying skin changes.  No peripheral edema.    Independent ECG/labs interpretation:  The following labs were independently interpreted:  CBC without leukocytosis or anemia.  Metabolic panel with hyperglycemia at 223.  No other electrolyte disturbance. Hepatic and renal function normal.  Initial troponin 4.    Independent visualization and interpretation of imaging: I independently visualized the following imaging with scope of interpretation limited to determining acute life threatening conditions related to emergency care: chest x-ray, which revealed minimal bibasilar atelectasis.    Treatment and Reassessment: Patient given IV fluids. Upon reassessment, patient reports his chest pain has resolved, but he does have lingering chest tightness.  Will give patient a breathing treatment and reassess given his history of asthma.   Disposition:   3:40 PM Care of patient transferred to resident Dr. Rhys Martini and attending Dr. Vonita Moss at the end of my shift as the patient will require reassessment once labs/imaging have  resulted. Patient presentation, ED course, and plan of care discussed with review of all pertinent labs and imaging. Please see his/her note for further details regarding further ED course and disposition. Plan at time of handoff is follow up on 2nd troponin.  Patient has outpatient PCP and cardiologist he can follow up with.  Chest pain is atypical and workup so far is reassuring.  This may be altered or completely changed at the discretion of the oncoming team pending results of further workup.   Social Determinants of Health:   Patient's  tobacco abuse  increases the complexity of managing their presentation         Final Clinical Impression(s) / ED Diagnoses Final diagnoses:  Chest pain, unspecified type  Feeling of chest tightness    Rx / DC Orders ED Discharge Orders     None         Lenard Simmer, PA-C 10/10/22 1541    Lonell Grandchild, MD 10/11/22 0745

## 2022-10-10 NOTE — ED Triage Notes (Signed)
Patient bib [facility] from work with complaints of chest pain. Patient states it started at 8am while he was driving and was mild and has progressed to feel like an elephant was sitting on his chest. EMS states he rated his pain 8/10 on arrival, he was also diaphoretic, anxious, having back pain and central chest pain that radiated to the left and tingled down his arm. 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 ON arrival to the ED patient is Alert and oriented x4 and denies any pain.   EMS gave nitroglycerin 0.4 twice, 4mg  of morphine, and 324mg  of aspirin enroute.

## 2022-10-10 NOTE — ED Provider Notes (Signed)
Clinical Course as of 10/10/22 1524  Fri Oct 10, 2022  1522 Here with chest pain and tightness. Hx of copd. Getting breathing treatment. Work up otherwise unremarkable.  - f/u second troponin - he will follow up with hx cardiologist and pcp [CD]    Clinical Course User Index [CD] Rhys Martini, DO    Physical Exam  BP 129/81   Pulse (!) 58   Temp 97.9 F (36.6 C)   Resp 20   Ht 5\' 7"  (1.702 m)   Wt 108.9 kg   SpO2 98%   BMI 37.59 kg/m   Physical Exam Vitals and nursing note reviewed.  Constitutional:      General: He is not in acute distress.    Appearance: He is well-developed.  HENT:     Head: Normocephalic and atraumatic.  Eyes:     Conjunctiva/sclera: Conjunctivae normal.  Cardiovascular:     Rate and Rhythm: Normal rate and regular rhythm.     Heart sounds: No murmur heard. Pulmonary:     Effort: Pulmonary effort is normal. No respiratory distress.     Breath sounds: Normal breath sounds.     Comments: Saturating well on room air Musculoskeletal:        General: No swelling or tenderness.     Cervical back: Neck supple.  Skin:    General: Skin is warm and dry.     Capillary Refill: Capillary refill takes less than 2 seconds.  Neurological:     Mental Status: He is alert.  Psychiatric:        Mood and Affect: Mood normal.     Procedures  Procedures  ED Course / MDM   Clinical Course as of 10/10/22 1524  Fri Oct 10, 2022  1522 Here with chest pain and tightness. Hx of copd. Getting breathing treatment. Work up otherwise unremarkable.  - f/u second troponin - he will follow up with hx cardiologist and pcp [CD]    Clinical Course User Index [CD] Rhys Martini, DO   Medical Decision Making Problems Addressed: Chest pain, unspecified type: complicated acute illness or injury Feeling of chest tightness: complicated acute illness or injury  Amount and/or Complexity of Data Reviewed Labs: ordered. Radiology: ordered and independent  interpretation performed. ECG/medicine tests: ordered and independent interpretation performed.  Risk Prescription drug management.   Patient initially presented with chest tightness.  He received breathing treatment prior to my evaluation.  He states that he feels significantly improved following breathing treatment.  He is saturating well on room air.  He has no wheezing present on exam.  He is ambulatory without difficulty or symptoms.  Repeat troponin is negative.  His blood sugar was initially elevated on arrival to 223.  Recheck blood sugar is 127.  Discussed at length patient's need for further follow-up with his primary care physician as well as with cardiology.  Patient and family state understanding.  They are in agreement with plan for discharge at this time with close follow-up.  Provided with strict return precautions.       Rhys Martini, DO 10/11/22 0015    Rondel Baton, MD 10/11/22 [phone]

## 2022-10-10 NOTE — Discharge Instructions (Signed)
Follow up with your primary care physician and your cardiologist as soon as possible. Your troponins (heart enzymes) were normal on your visit today. Your blood sugar was elevated but improved. Discuss this with your pcp regarding any further home changes may be indicated. Monitor for any signs of worsening including worsening chest pain, sob, nausea, vomiting, or lightheadedness. Return to the ED for any worsening of symptoms or further concerns.
